# Patient Record
Sex: Male | Born: 1960 | Race: White | Hispanic: No | Marital: Married | Smoking: Never smoker
Health system: Southern US, Community
[De-identification: ages and names within clinical notes are randomized; demographics above are authoritative.]

## PROBLEM LIST (undated history)

## (undated) DIAGNOSIS — R7303 Prediabetes: Secondary | ICD-10-CM

## (undated) HISTORY — PX: COLONOSCOPY: SHX174

---

## 1985-10-08 HISTORY — PX: WISDOM TOOTH EXTRACTION: SHX21

## 1986-10-08 HISTORY — PX: SHOULDER ARTHROSCOPY: SHX128

## 1991-10-09 HISTORY — PX: SHOULDER ARTHROSCOPY W/ ROTATOR CUFF REPAIR: SHX2400

## 2015-01-14 ENCOUNTER — Other Ambulatory Visit: Payer: Self-pay | Admitting: Orthopedic Surgery

## 2015-01-14 ENCOUNTER — Ambulatory Visit
Admission: RE | Admit: 2015-01-14 | Discharge: 2015-01-14 | Disposition: A | Discharge status: Home / Self Care | Payer: Self-pay | Source: Ambulatory Visit | Attending: Orthopedic Surgery | Admitting: Orthopedic Surgery

## 2015-01-14 DIAGNOSIS — M25512 Pain in left shoulder: Secondary | ICD-10-CM

## 2015-01-25 ENCOUNTER — Other Ambulatory Visit: Payer: Self-pay | Admitting: Orthopedic Surgery

## 2015-01-25 DIAGNOSIS — M545 Low back pain: Secondary | ICD-10-CM

## 2015-02-04 ENCOUNTER — Ambulatory Visit
Admission: RE | Admit: 2015-02-04 | Discharge: 2015-02-04 | Disposition: A | Discharge status: Home / Self Care | Payer: Commercial Managed Care - PPO | Source: Ambulatory Visit | Attending: Orthopedic Surgery | Admitting: Orthopedic Surgery

## 2015-02-04 DIAGNOSIS — M545 Low back pain: Secondary | ICD-10-CM

## 2015-02-04 MED ORDER — IOHEXOL 180 MG/ML  SOLN
15.0000 mL | Freq: Once | INTRAMUSCULAR | Status: AC | PRN
Start: 2015-02-04 — End: 2015-02-04
  Administered 2015-02-04: 15 mL via INTRATHECAL

## 2015-02-04 MED ORDER — ONDANSETRON HCL 4 MG/2ML IJ SOLN: 4.0000 mg | Freq: Four times a day (QID) | INTRAMUSCULAR | Status: DC | PRN

## 2015-02-04 MED ORDER — DIAZEPAM 5 MG PO TABS
10.0000 mg | ORAL_TABLET | Freq: Once | ORAL | Status: AC
  Administered 2015-02-04: 5 mg via ORAL

## 2015-02-04 NOTE — Discharge Instructions (Signed)

## 2018-05-01 DIAGNOSIS — J342 Deviated nasal septum: Secondary | ICD-10-CM | POA: Diagnosis not present

## 2018-05-01 DIAGNOSIS — Z7289 Other problems related to lifestyle: Secondary | ICD-10-CM | POA: Diagnosis not present

## 2018-05-01 DIAGNOSIS — J358 Other chronic diseases of tonsils and adenoids: Secondary | ICD-10-CM | POA: Diagnosis not present

## 2018-08-01 DIAGNOSIS — D649 Anemia, unspecified: Secondary | ICD-10-CM | POA: Diagnosis not present

## 2018-08-01 DIAGNOSIS — Z23 Encounter for immunization: Secondary | ICD-10-CM | POA: Diagnosis not present

## 2018-08-01 DIAGNOSIS — J309 Allergic rhinitis, unspecified: Secondary | ICD-10-CM | POA: Diagnosis not present

## 2018-08-01 DIAGNOSIS — Z1339 Encounter for screening examination for other mental health and behavioral disorders: Secondary | ICD-10-CM | POA: Diagnosis not present

## 2018-08-01 DIAGNOSIS — Z0000 Encounter for general adult medical examination without abnormal findings: Secondary | ICD-10-CM | POA: Diagnosis not present

## 2018-08-14 DIAGNOSIS — L570 Actinic keratosis: Secondary | ICD-10-CM | POA: Diagnosis not present

## 2018-08-14 DIAGNOSIS — L821 Other seborrheic keratosis: Secondary | ICD-10-CM | POA: Diagnosis not present

## 2018-09-06 DIAGNOSIS — S81012A Laceration without foreign body, left knee, initial encounter: Secondary | ICD-10-CM | POA: Diagnosis not present

## 2018-09-06 DIAGNOSIS — Z23 Encounter for immunization: Secondary | ICD-10-CM | POA: Diagnosis not present

## 2018-11-04 DIAGNOSIS — J358 Other chronic diseases of tonsils and adenoids: Secondary | ICD-10-CM | POA: Diagnosis not present

## 2019-01-31 DIAGNOSIS — J0100 Acute maxillary sinusitis, unspecified: Secondary | ICD-10-CM | POA: Diagnosis not present

## 2019-07-16 DIAGNOSIS — Z1331 Encounter for screening for depression: Secondary | ICD-10-CM | POA: Diagnosis not present

## 2019-07-16 DIAGNOSIS — J309 Allergic rhinitis, unspecified: Secondary | ICD-10-CM | POA: Diagnosis not present

## 2019-07-16 DIAGNOSIS — D649 Anemia, unspecified: Secondary | ICD-10-CM | POA: Diagnosis not present

## 2019-07-16 DIAGNOSIS — R7303 Prediabetes: Secondary | ICD-10-CM | POA: Diagnosis not present

## 2019-07-16 DIAGNOSIS — Z0000 Encounter for general adult medical examination without abnormal findings: Secondary | ICD-10-CM | POA: Diagnosis not present

## 2019-07-17 DIAGNOSIS — Z0000 Encounter for general adult medical examination without abnormal findings: Secondary | ICD-10-CM | POA: Diagnosis not present

## 2019-07-17 DIAGNOSIS — Z13220 Encounter for screening for lipoid disorders: Secondary | ICD-10-CM | POA: Diagnosis not present

## 2019-10-15 DIAGNOSIS — D225 Melanocytic nevi of trunk: Secondary | ICD-10-CM | POA: Diagnosis not present

## 2019-10-15 DIAGNOSIS — L570 Actinic keratosis: Secondary | ICD-10-CM | POA: Diagnosis not present

## 2019-10-15 DIAGNOSIS — C44529 Squamous cell carcinoma of skin of other part of trunk: Secondary | ICD-10-CM | POA: Diagnosis not present

## 2020-07-21 DIAGNOSIS — Z0000 Encounter for general adult medical examination without abnormal findings: Secondary | ICD-10-CM | POA: Diagnosis not present

## 2020-07-21 DIAGNOSIS — Z23 Encounter for immunization: Secondary | ICD-10-CM | POA: Diagnosis not present

## 2020-07-21 DIAGNOSIS — Z1331 Encounter for screening for depression: Secondary | ICD-10-CM | POA: Diagnosis not present

## 2020-07-21 DIAGNOSIS — D649 Anemia, unspecified: Secondary | ICD-10-CM | POA: Diagnosis not present

## 2020-07-21 DIAGNOSIS — J309 Allergic rhinitis, unspecified: Secondary | ICD-10-CM | POA: Diagnosis not present

## 2020-07-21 DIAGNOSIS — R7303 Prediabetes: Secondary | ICD-10-CM | POA: Diagnosis not present

## 2020-09-29 DIAGNOSIS — G5761 Lesion of plantar nerve, right lower limb: Secondary | ICD-10-CM | POA: Diagnosis not present

## 2021-07-24 DIAGNOSIS — R7303 Prediabetes: Secondary | ICD-10-CM | POA: Diagnosis not present

## 2021-07-24 DIAGNOSIS — Z0000 Encounter for general adult medical examination without abnormal findings: Secondary | ICD-10-CM | POA: Diagnosis not present

## 2021-07-24 DIAGNOSIS — Z1331 Encounter for screening for depression: Secondary | ICD-10-CM | POA: Diagnosis not present

## 2021-07-24 DIAGNOSIS — Z23 Encounter for immunization: Secondary | ICD-10-CM | POA: Diagnosis not present

## 2021-08-22 DIAGNOSIS — R972 Elevated prostate specific antigen [PSA]: Secondary | ICD-10-CM | POA: Diagnosis not present

## 2021-08-22 DIAGNOSIS — R7303 Prediabetes: Secondary | ICD-10-CM | POA: Diagnosis not present

## 2021-08-22 DIAGNOSIS — J309 Allergic rhinitis, unspecified: Secondary | ICD-10-CM | POA: Diagnosis not present

## 2021-08-22 DIAGNOSIS — D649 Anemia, unspecified: Secondary | ICD-10-CM | POA: Diagnosis not present

## 2021-08-24 DIAGNOSIS — J309 Allergic rhinitis, unspecified: Secondary | ICD-10-CM | POA: Diagnosis not present

## 2021-08-24 DIAGNOSIS — R7303 Prediabetes: Secondary | ICD-10-CM | POA: Diagnosis not present

## 2021-08-24 DIAGNOSIS — D649 Anemia, unspecified: Secondary | ICD-10-CM | POA: Diagnosis not present

## 2021-08-24 DIAGNOSIS — R972 Elevated prostate specific antigen [PSA]: Secondary | ICD-10-CM | POA: Diagnosis not present

## 2021-09-28 DIAGNOSIS — R7303 Prediabetes: Secondary | ICD-10-CM | POA: Diagnosis not present

## 2021-09-28 DIAGNOSIS — K589 Irritable bowel syndrome without diarrhea: Secondary | ICD-10-CM | POA: Diagnosis not present

## 2021-09-28 DIAGNOSIS — R519 Headache, unspecified: Secondary | ICD-10-CM | POA: Diagnosis not present

## 2021-09-28 DIAGNOSIS — R972 Elevated prostate specific antigen [PSA]: Secondary | ICD-10-CM | POA: Diagnosis not present

## 2021-10-11 DIAGNOSIS — R972 Elevated prostate specific antigen [PSA]: Secondary | ICD-10-CM | POA: Diagnosis not present

## 2021-10-25 ENCOUNTER — Other Ambulatory Visit: Payer: Self-pay | Admitting: Urology

## 2021-10-25 DIAGNOSIS — R972 Elevated prostate specific antigen [PSA]: Secondary | ICD-10-CM

## 2021-11-08 ENCOUNTER — Other Ambulatory Visit: Payer: Self-pay

## 2021-11-08 ENCOUNTER — Ambulatory Visit
Admission: RE | Admit: 2021-11-08 | Discharge: 2021-11-08 | Disposition: A | Discharge status: Home / Self Care | Payer: BC Managed Care – PPO | Source: Ambulatory Visit | Attending: Urology | Admitting: Urology

## 2021-11-08 DIAGNOSIS — R590 Localized enlarged lymph nodes: Secondary | ICD-10-CM | POA: Diagnosis not present

## 2021-11-08 DIAGNOSIS — R972 Elevated prostate specific antigen [PSA]: Secondary | ICD-10-CM | POA: Diagnosis not present

## 2021-11-08 DIAGNOSIS — N4283 Cyst of prostate: Secondary | ICD-10-CM | POA: Diagnosis not present

## 2021-11-08 MED ORDER — GADOBENATE DIMEGLUMINE 529 MG/ML IV SOLN
15.0000 mL | Freq: Once | INTRAVENOUS | Status: AC | PRN
  Administered 2021-11-08: 15 mL via INTRAVENOUS

## 2021-11-15 DIAGNOSIS — H43812 Vitreous degeneration, left eye: Secondary | ICD-10-CM | POA: Diagnosis not present

## 2021-12-25 DIAGNOSIS — C61 Malignant neoplasm of prostate: Secondary | ICD-10-CM | POA: Diagnosis not present

## 2021-12-25 DIAGNOSIS — R972 Elevated prostate specific antigen [PSA]: Secondary | ICD-10-CM | POA: Diagnosis not present

## 2022-01-04 DIAGNOSIS — C61 Malignant neoplasm of prostate: Secondary | ICD-10-CM | POA: Diagnosis not present

## 2022-01-08 ENCOUNTER — Telehealth: Payer: Self-pay | Admitting: Radiation Oncology

## 2022-01-08 NOTE — Telephone Encounter (Signed)
4/3 @ 8:52 am Left voicemail for patient to call our office.   ?

## 2022-01-09 ENCOUNTER — Telehealth: Payer: Self-pay | Admitting: Radiation Oncology

## 2022-01-09 NOTE — Telephone Encounter (Signed)
4/4 @ 9:13 am Left voicemail for patient to call our office. ?

## 2022-01-18 DIAGNOSIS — H43812 Vitreous degeneration, left eye: Secondary | ICD-10-CM | POA: Diagnosis not present

## 2022-02-08 DIAGNOSIS — C61 Malignant neoplasm of prostate: Secondary | ICD-10-CM | POA: Diagnosis not present

## 2022-02-15 DIAGNOSIS — R278 Other lack of coordination: Secondary | ICD-10-CM | POA: Diagnosis not present

## 2022-02-15 DIAGNOSIS — R350 Frequency of micturition: Secondary | ICD-10-CM | POA: Diagnosis not present

## 2022-02-20 ENCOUNTER — Other Ambulatory Visit: Payer: Self-pay | Admitting: Urology

## 2022-02-21 DIAGNOSIS — C61 Malignant neoplasm of prostate: Secondary | ICD-10-CM | POA: Diagnosis not present

## 2022-02-23 DIAGNOSIS — C61 Malignant neoplasm of prostate: Secondary | ICD-10-CM | POA: Diagnosis not present

## 2022-03-06 DIAGNOSIS — M6281 Muscle weakness (generalized): Secondary | ICD-10-CM | POA: Diagnosis not present

## 2022-03-06 DIAGNOSIS — M62838 Other muscle spasm: Secondary | ICD-10-CM | POA: Diagnosis not present

## 2022-03-06 DIAGNOSIS — N393 Stress incontinence (female) (male): Secondary | ICD-10-CM | POA: Diagnosis not present

## 2022-03-08 DIAGNOSIS — C61 Malignant neoplasm of prostate: Secondary | ICD-10-CM | POA: Diagnosis not present

## 2022-03-09 NOTE — Patient Instructions (Addendum)
DUE TO COVID-19 ONLY TWO VISITORS  (aged 63 and older)  ARE ALLOWED TO COME WITH YOU AND STAY IN THE WAITING ROOM ONLY DURING PRE OP AND PROCEDURE.   **NO VISITORS ARE ALLOWED IN THE SHORT STAY AREA OR RECOVERY ROOM!!**  IF YOU WILL BE ADMITTED INTO THE HOSPITAL YOU ARE ALLOWED ONLY FOUR SUPPORT PEOPLE DURING VISITATION HOURS ONLY (7 AM -8PM)   The support person(s) must pass our screening, gel in and out, and wear a mask at all times, including in the patient's room. Patients must also wear a mask when staff or their support person are in the room. Visitors GUEST BADGE MUST BE WORN VISIBLY  One adult visitor may remain with you overnight and MUST be in the room by 8 P.M.     Your procedure is scheduled on: 03/23/22   Report to [facility] Main Entrance    Report to admitting at  5:15 AM   Call this number if you have problems the morning of surgery [phone]   Do not eat food :After Midnight. On 03/22/22. Start with a clear liquid diet                                                                                                                                    until __midnight__DAY OF SURGERY  Water Black Coffee (sugar ok, NO MILK/CREAM OR CREAMERS)  Tea (sugar ok, NO MILK/CREAM OR CREAMERS) regular and decaf                             Plain Jell-O (NO RED)                                           Fruit ices (not with fruit pulp, NO RED)                                     Popsicles (NO RED)                                                                  Juice: apple, WHITE grape, WHITE cranberry Sports drinks like Gatorade (NO RED) Clear broth(vegetable,chicken,beef)     Nothing more by mouth after midnight.                      If you have questions, please contact your surgeon's office.   FOLLOW BOWEL PREP AND ANY ADDITIONAL PRE OP INSTRUCTIONS YOU  RECEIVED FROM YOUR SURGEON'S OFFICE!!!     Oral Hygiene is also important to reduce your risk of infection.                                     Remember - BRUSH YOUR TEETH THE MORNING OF SURGERY WITH YOUR REGULAR TOOTHPASTE   Do NOT smoke after Midnight   Take these medicines the morning of surgery with A SIP OF WATER: Flonase if needed   Bring CPAP mask and tubing day of surgery.                              You may not have any metal on your body including jewelry, and body piercing             Do not wear lotions, powders, perfumes/cologne, or deodorant                Men may shave face and neck.   Do not bring valuables to the hospital. [place].   Contacts, dentures or bridgework may not be worn into surgery.   Bring small overnight bag day of surgery.     Special Instructions: Bring a copy of your healthcare power of attorney and living will documents the day of surgery if you haven't scanned them before.              Please read over the following fact sheets you were given: IF YOU HAVE QUESTIONS ABOUT YOUR PRE-OP INSTRUCTIONS PLEASE CALL [phone]     [facility] - Preparing for Surgery Before surgery, you can play an important role.  Because skin is not sterile, your skin needs to be as free of germs as possible.  You can reduce the number of germs on your skin by washing with CHG (chlorahexidine gluconate) soap before surgery.  CHG is an antiseptic cleaner which kills germs and bonds with the skin to continue killing germs even after washing. Please DO NOT use if you have an allergy to CHG or antibacterial soaps.  If your skin becomes reddened/irritated stop using the CHG and inform your nurse when you arrive at Short Stay.  You may shave your face/neck. Please follow these instructions carefully:  1.  Shower with CHG Soap the night before surgery and the  morning of Surgery.  2.  If you choose to wash your hair, wash your hair first as usual with your  normal  shampoo.  3.  After you shampoo, rinse your hair and body  thoroughly to remove the  shampoo.                            4.  Use CHG as you would any other liquid soap.  You can apply chg directly  to the skin and wash                       Gently with a scrungie or clean washcloth.  5.  Apply the CHG Soap to your body ONLY FROM THE NECK DOWN.   Do not use on face/ open  Wound or open sores. Avoid contact with eyes, ears mouth and genitals (private parts).                       Wash face,  Genitals (private parts) with your normal soap.             6.  Wash thoroughly, paying special attention to the area where your surgery  will be performed.  7.  Thoroughly rinse your body with warm water from the neck down.  8.  DO NOT shower/wash with your normal soap after using and rinsing off  the CHG Soap.                9.  Pat yourself dry with a clean towel.            10.  Wear clean pajamas.            11.  Place clean sheets on your bed the night of your first shower and do not  sleep with pets. Day of Surgery : Do not apply any lotions/deodorants the morning of surgery.  Please wear clean clothes to the hospital/surgery center.  FAILURE TO FOLLOW THESE INSTRUCTIONS MAY RESULT IN THE CANCELLATION OF YOUR SURGERY    ________________________________________________________________________   Incentive Spirometer  An incentive spirometer is a tool that can help keep your lungs clear and active. This tool measures how well you are filling your lungs with each breath. Taking long deep breaths may help reverse or decrease the chance of developing breathing (pulmonary) problems (especially infection) following: A long period of time when you are unable to move or be active. BEFORE THE PROCEDURE  If the spirometer includes an indicator to show your best effort, your nurse or respiratory therapist will set it to a desired goal. If possible, sit up straight or lean slightly forward. Try not to slouch. Hold the incentive spirometer in  an upright position. INSTRUCTIONS FOR USE  Sit on the edge of your bed if possible, or sit up as far as you can in bed or on a chair. Hold the incentive spirometer in an upright position. Breathe out normally. Place the mouthpiece in your mouth and seal your lips tightly around it. Breathe in slowly and as deeply as possible, raising the piston or the ball toward the top of the column. Hold your breath for 3-5 seconds or for as long as possible. Allow the piston or ball to fall to the bottom of the column. Remove the mouthpiece from your mouth and breathe out normally. Rest for a few seconds and repeat Steps 1 through 7 at least 10 times every 1-2 hours when you are awake. Take your time and take a few normal breaths between deep breaths. The spirometer may include an indicator to show your best effort. Use the indicator as a goal to work toward during each repetition. After each set of 10 deep breaths, practice coughing to be sure your lungs are clear. If you have an incision (the cut made at the time of surgery), support your incision when coughing by placing a pillow or rolled up towels firmly against it. Once you are able to get out of bed, walk around indoors and cough well. You may stop using the incentive spirometer when instructed by your caregiver.  RISKS AND COMPLICATIONS Take your time so you do not get dizzy or light-headed. If you are in pain, you may need to take or ask for pain medication  before doing incentive spirometry. It is harder to take a deep breath if you are having pain. AFTER USE Rest and breathe slowly and easily. It can be helpful to keep track of a log of your progress. Your caregiver can provide you with a simple table to help with this. If you are using the spirometer at home, follow these instructions: [place] IF:  You are having difficultly using the spirometer. You have trouble using the spirometer as often as instructed. Your pain medication is not  giving enough relief while using the spirometer. You develop fever of 100.5 F (38.1 C) or higher. SEEK IMMEDIATE MEDICAL CARE IF:  You cough up bloody sputum that had not been present before. You develop fever of 102 F (38.9 C) or greater. You develop worsening pain at or near the incision site. MAKE SURE YOU:  Understand these instructions. Will watch your condition. Will get help right away if you are not doing well or get worse. Document Released: 02/04/2007 Document Revised: 12/17/2011 Document Reviewed: 04/07/2007 [facility] Patient Information 2014 [place], [place].   ________________________________________________________________________

## 2022-03-13 ENCOUNTER — Other Ambulatory Visit: Payer: Self-pay

## 2022-03-13 ENCOUNTER — Encounter (HOSPITAL_COMMUNITY)
Admission: RE | Admit: 2022-03-13 | Discharge: 2022-03-13 | Disposition: A | Discharge status: Home / Self Care | Payer: BC Managed Care – PPO | Source: Ambulatory Visit | Attending: Urology | Admitting: Urology

## 2022-03-13 ENCOUNTER — Encounter (HOSPITAL_COMMUNITY): Payer: Self-pay

## 2022-03-13 VITALS — HR 60 | Temp 98.5°F | Resp 18 | Ht 73.0 in | Wt 186.0 lb

## 2022-03-13 DIAGNOSIS — E119 Type 2 diabetes mellitus without complications: Secondary | ICD-10-CM | POA: Diagnosis not present

## 2022-03-13 DIAGNOSIS — C61 Malignant neoplasm of prostate: Secondary | ICD-10-CM | POA: Diagnosis not present

## 2022-03-13 DIAGNOSIS — Z01818 Encounter for other preprocedural examination: Secondary | ICD-10-CM | POA: Insufficient documentation

## 2022-03-13 HISTORY — DX: Prediabetes: R73.03

## 2022-03-13 LAB — BASIC METABOLIC PANEL
Anion gap: 7 (ref 5–15)
BUN: 19 mg/dL (ref 8–23)
CO2: 28 mmol/L (ref 22–32)
Calcium: 9.9 mg/dL (ref 8.9–10.3)
Chloride: 107 mmol/L (ref 98–111)
Creatinine, Ser: 1 mg/dL (ref 0.61–1.24)
GFR, Estimated: >60 mL/min (ref >60)
Glucose, Bld: 116 mg/dL — ABNORMAL HIGH (ref 70–99)
Potassium: 4.5 mmol/L (ref 3.5–5.1)
Sodium: 142 mmol/L (ref 135–145)

## 2022-03-13 LAB — GLUCOSE, CAPILLARY: Glucose-Capillary: 97 mg/dL (ref 70–99)

## 2022-03-13 LAB — CBC
HCT: 41.2 % (ref 39.0–52.0)
Hemoglobin: 13.9 g/dL (ref 13.0–17.0)
MCH: 30.8 pg (ref 26.0–34.0)
MCHC: 33.7 g/dL (ref 30.0–36.0)
MCV: 91.2 fL (ref 80.0–100.0)
Platelets: 216 10*3/uL (ref 150–400)
RBC: 4.52 MIL/uL (ref 4.22–5.81)
RDW: 13.1 % (ref 11.5–15.5)
WBC: 6.4 10*3/uL (ref 4.0–10.5)
nRBC: 0 % (ref 0.0–0.2)

## 2022-03-13 LAB — HEMOGLOBIN A1C
Hgb A1c MFr Bld: 5.8 % — ABNORMAL HIGH (ref 4.8–5.6)
Mean Plasma Glucose: 119.76 mg/dL

## 2022-03-13 NOTE — Progress Notes (Signed)
Anesthesia note:  Bowel prep reminder:yes. Reviewed with Pt  PCP - Dr. Steffanie Rainwater Cardiologist - Other-   Chest x-ray - no EKG - 03/13/22-chart Stress Test - no ECHO - no Cardiac Cath - NA  Pacemaker/ICD device last checked:NA  Sleep Study - no CPAP -   Pt is pre diabetic- yes. He doesn't test Fasting Blood Sugar -  Checks Blood Sugar _____  Blood Thinner:NA Blood Thinner Instructions: Aspirin Instructions: Last Dose:  Anesthesia review: no  Patient denies shortness of breath, fever, cough and chest pain at PAT appointment Pt has no SOB with any activities. His BP was elevated at PAT. 148/93 and 140/100. I told him to monitor his BP at home and call his PCP if it stays high. EKG was done at PAT.  Patient verbalized understanding of instructions that were given to them at the PAT appointment. Patient was also instructed that they will need to review over the PAT instructions again at home before surgery. yes

## 2022-03-14 LAB — URINE CULTURE: Culture: NO GROWTH

## 2022-03-23 ENCOUNTER — Ambulatory Visit (HOSPITAL_COMMUNITY): Admission: RE | Admit: 2022-03-23 | Payer: BC Managed Care – PPO | Source: Home / Self Care | Admitting: Urology

## 2022-03-23 ENCOUNTER — Encounter (HOSPITAL_COMMUNITY): Admission: RE | Payer: Self-pay | Source: Home / Self Care

## 2022-03-23 LAB — TYPE AND SCREEN
ABO/RH(D): B POS
Antibody Screen: NEGATIVE

## 2022-03-23 SURGERY — PROSTATECTOMY, RADICAL, ROBOT-ASSISTED, LAPAROSCOPIC
Anesthesia: General

## 2022-04-28 IMAGING — MR MR PROSTATE WO/W CM
12 series · 48 of 48 positions shown · IV contrast (multihance)
Comparison: None.

CLINICAL DATA: Elevated PSA level of 7.5 in July 2021.

EXAM:
MR PROSTATE WITHOUT AND WITH CONTRAST
TECHNIQUE: Multiplanar multisequence MRI images were obtained of the pelvis
centered about the prostate. Pre and post contrast images were
obtained.
CONTRAST:  15mL MULTIHANCE GADOBENATE DIMEGLUMINE 529 MG/ML IV SOLN

[Series 3: T2 · coronal · 3.0mm · 0.56mm/px · 1 of 21 slices shown (1 of 3)]
[im 1/21]
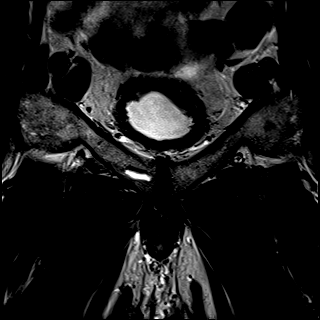

[Series 4: T1 · axial · 5.0mm · 1.25mm/px · z∈[-48,+147]mm · 2 of 80 slices shown]
[im 1/80]
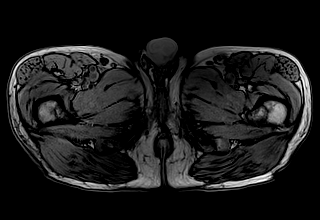
[im 80/80]
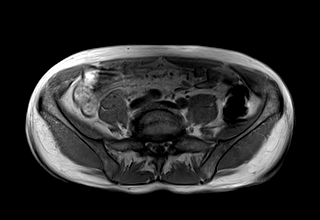

[Series 5: DWI · axial · 3.0mm · 1.75mm/px · 1 of 60 slices shown (1 of 3)]
[im 1/60]
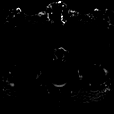

[Series 6: DWI · axial · 3.0mm · 1.75mm/px · 1 of 20 slices shown (2 of 3)]
[im 1/20]
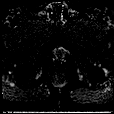

[Series 7: DWI · axial · 3.0mm · 1.75mm/px · 1 of 20 slices shown (3 of 3)]
[im 1/20]
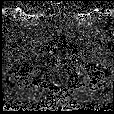

[Series 8: T2 · axial · 3.0mm · 0.56mm/px · 1 of 21 slices shown (2 of 3)]
[im 1/21]
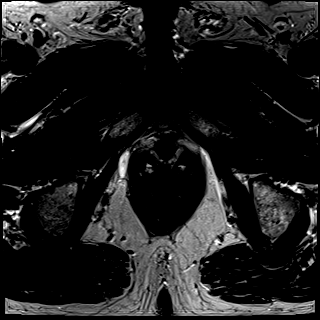

[Series 9: T2 · axial · 1.0mm · 1.04mm/px · 1 of 60 slices shown (3 of 3)]
[im 1/60]
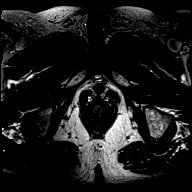

[Series 10: pre t1_twist_tra_dyn · axial · non-contrast · 3.5mm · 0.94mm/px · 1 of 18 slices shown]
[im 1/18]
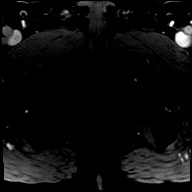

[Series 11: post t1_twist_tra_dyn-copy center · axial · non-contrast · 3.5mm · 0.94mm/px · z∈[-15,+45]mm · 17 of 540 slices shown]
[im 1/540]
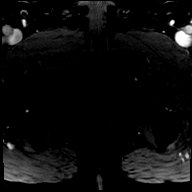
[im 34/540]
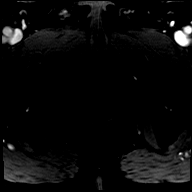
[im 68/540]
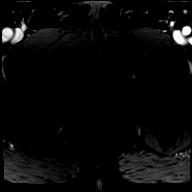
[im 102/540]
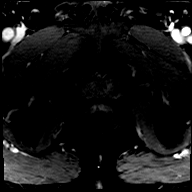
[im 135/540]
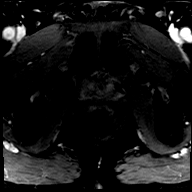
[im 169/540]
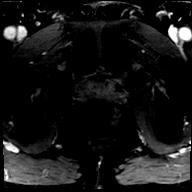
[im 203/540]
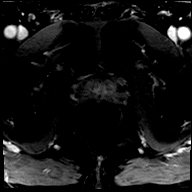
[im 236/540]
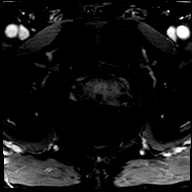
[im 270/540]
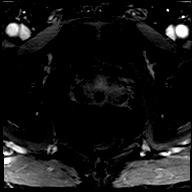
[im 304/540]
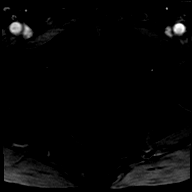
[im 337/540]
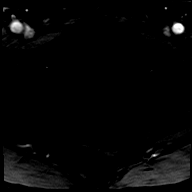
[im 371/540]
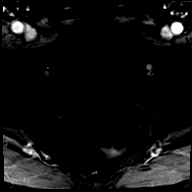
[im 405/540]
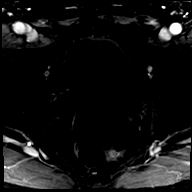
[im 438/540]
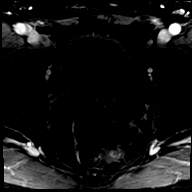
[im 472/540]
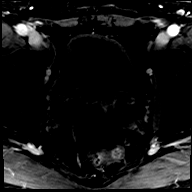
[im 506/540]
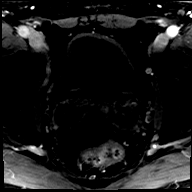
[im 540/540]
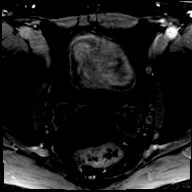

[Series 12: post t1_twist_tra_dyn-copy cent_sub · axial · 3.5mm · 0.94mm/px · z∈[-15,+45]mm · 16 of 521 slices shown]
[im 1/521]
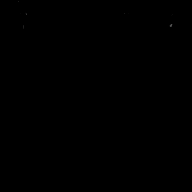
[im 35/521]
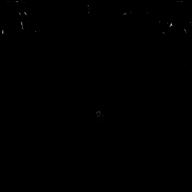
[im 70/521]
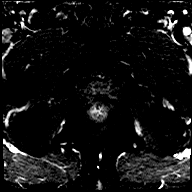
[im 105/521]
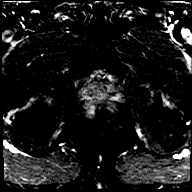
[im 139/521]
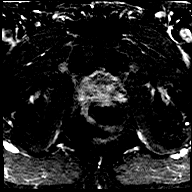
[im 174/521]
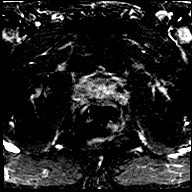
[im 209/521]
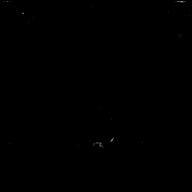
[im 243/521]
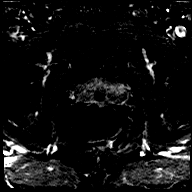
[im 278/521]
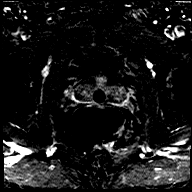
[im 313/521]
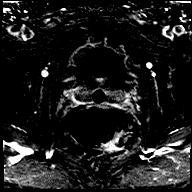
[im 347/521]
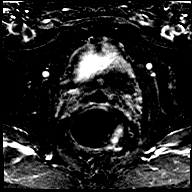
[im 382/521]
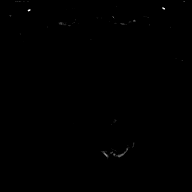
[im 417/521]
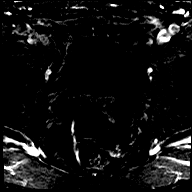
[im 451/521]
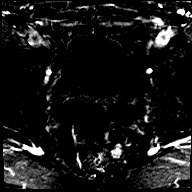
[im 486/521]
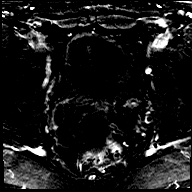
[im 521/521]
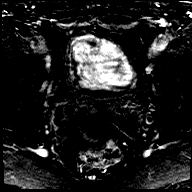

[Series 13: t1_vibe_dixon_tra_f · axial · 2.5mm · 0.91mm/px · z∈[-48,+149]mm · 3 of 80 slices shown]
[im 1/80]
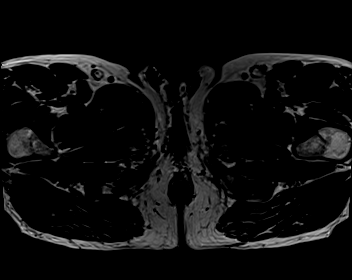
[im 40/80]
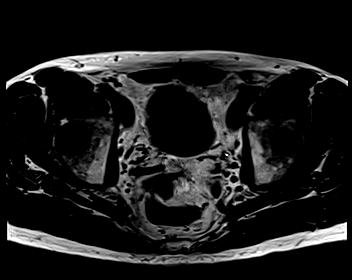
[im 80/80]
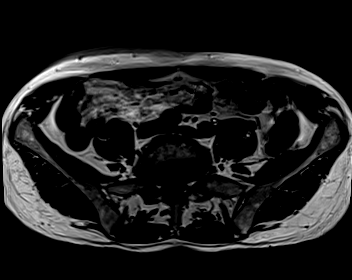

[Series 14: t1_vibe_dixon_tra_w · axial · 2.5mm · 0.91mm/px · z∈[-48,+149]mm · 3 of 80 slices shown]
[im 1/80]
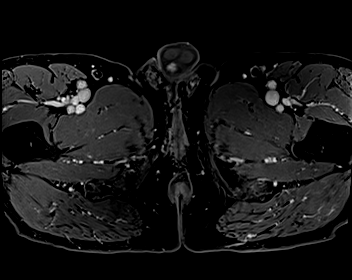
[im 40/80]
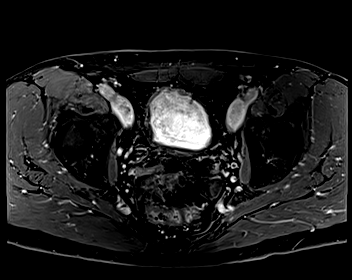
[im 80/80]
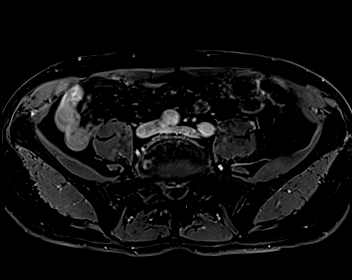

[48 of 48 positions shown; findings below may reference images not displayed]

FINDINGS: Prostate: Midline cystic lesion along the posterosuperior prostate
gland measuring about 1.8 by 1.2 by 1.3 cm, possibilities may
include prostatic utricle cyst or mullerian duct cyst.

Hazy bilateral stranding in the peripheral zones with a linear and
wedge-like configuration posterolaterally, but relatively nonfocal
and accordingly considered PI-RADS category 2.

No substantial restriction of diffusion. No substantial or early
focal enhancement.

Volume: 3D volumetric analysis: Prostate volume 27.63 cc (5.3 by
by 3.5 cm).

Transcapsular spread:  Absent

Seminal vesicle involvement: Absent

Neurovascular bundle involvement: Absent

Pelvic adenopathy: Absent

Bone metastasis: Absent

Other findings: No supplemental non-categorized findings.
IMPRESSION: 1. No findings of intermediate or higher suspicion for prostate
cancer identified. Hazy bilateral stranding in the peripheral zones
is likely postinflammatory and considered PI-RADS category 2.
2. Midline cystic lesion along the posterosuperior prostate gland,
probably a prostatic utricle cyst or mullerian duct cyst.

## 2022-06-05 DIAGNOSIS — Z23 Encounter for immunization: Secondary | ICD-10-CM | POA: Diagnosis not present

## 2022-07-02 DIAGNOSIS — Z23 Encounter for immunization: Secondary | ICD-10-CM | POA: Diagnosis not present

## 2022-08-13 DIAGNOSIS — Z23 Encounter for immunization: Secondary | ICD-10-CM | POA: Diagnosis not present

## 2022-08-22 DIAGNOSIS — J309 Allergic rhinitis, unspecified: Secondary | ICD-10-CM | POA: Diagnosis not present

## 2022-08-22 DIAGNOSIS — Z0000 Encounter for general adult medical examination without abnormal findings: Secondary | ICD-10-CM | POA: Diagnosis not present

## 2022-08-22 DIAGNOSIS — R7303 Prediabetes: Secondary | ICD-10-CM | POA: Diagnosis not present

## 2022-08-22 DIAGNOSIS — R030 Elevated blood-pressure reading, without diagnosis of hypertension: Secondary | ICD-10-CM | POA: Diagnosis not present

## 2022-09-06 DIAGNOSIS — M25562 Pain in left knee: Secondary | ICD-10-CM | POA: Diagnosis not present

## 2022-09-06 DIAGNOSIS — G8929 Other chronic pain: Secondary | ICD-10-CM | POA: Diagnosis not present

## 2022-09-18 DIAGNOSIS — N529 Male erectile dysfunction, unspecified: Secondary | ICD-10-CM | POA: Diagnosis not present

## 2022-09-18 DIAGNOSIS — C61 Malignant neoplasm of prostate: Secondary | ICD-10-CM | POA: Diagnosis not present

## 2022-09-19 DIAGNOSIS — R7303 Prediabetes: Secondary | ICD-10-CM | POA: Diagnosis not present

## 2022-09-19 DIAGNOSIS — I10 Essential (primary) hypertension: Secondary | ICD-10-CM | POA: Diagnosis not present

## 2022-09-19 DIAGNOSIS — E785 Hyperlipidemia, unspecified: Secondary | ICD-10-CM | POA: Diagnosis not present

## 2022-09-19 DIAGNOSIS — J309 Allergic rhinitis, unspecified: Secondary | ICD-10-CM | POA: Diagnosis not present
